# Patient Record
Sex: Female | Born: 1975 | Race: White | Hispanic: No | Smoking: Never smoker
Health system: Southern US, Community
[De-identification: ages and names within clinical notes are randomized; demographics above are authoritative.]

## PROBLEM LIST (undated history)

## (undated) HISTORY — PX: TUBAL LIGATION: SHX77

## (undated) HISTORY — PX: CHOLECYSTECTOMY: SHX55

---

## 2010-06-12 ENCOUNTER — Emergency Department (HOSPITAL_BASED_OUTPATIENT_CLINIC_OR_DEPARTMENT_OTHER)
Admission: EM | Admit: 2010-06-12 | Discharge: 2010-06-12 | Disposition: A | Discharge status: Home / Self Care | Payer: Self-pay | Attending: Emergency Medicine | Admitting: Emergency Medicine

## 2010-06-12 DIAGNOSIS — N390 Urinary tract infection, site not specified: Secondary | ICD-10-CM | POA: Insufficient documentation

## 2010-06-12 DIAGNOSIS — R300 Dysuria: Secondary | ICD-10-CM | POA: Insufficient documentation

## 2010-06-12 LAB — URINALYSIS, ROUTINE W REFLEX MICROSCOPIC
Glucose, UA: NEGATIVE mg/dL
Hgb urine dipstick: NEGATIVE
Ketones, ur: NEGATIVE mg/dL
pH: 6.5 (ref 5.0–8.0)

## 2010-06-12 LAB — URINE MICROSCOPIC-ADD ON

## 2010-07-24 ENCOUNTER — Emergency Department (HOSPITAL_BASED_OUTPATIENT_CLINIC_OR_DEPARTMENT_OTHER)
Admission: EM | Admit: 2010-07-24 | Discharge: 2010-07-24 | Disposition: A | Discharge status: Home / Self Care | Payer: Managed Care, Other (non HMO) | Attending: Emergency Medicine | Admitting: Emergency Medicine

## 2010-07-24 ENCOUNTER — Emergency Department (INDEPENDENT_AMBULATORY_CARE_PROVIDER_SITE_OTHER): Payer: Managed Care, Other (non HMO)

## 2010-07-24 DIAGNOSIS — R112 Nausea with vomiting, unspecified: Secondary | ICD-10-CM

## 2010-07-24 DIAGNOSIS — R51 Headache: Secondary | ICD-10-CM | POA: Insufficient documentation

## 2010-07-24 LAB — URINALYSIS, ROUTINE W REFLEX MICROSCOPIC
Ketones, ur: NEGATIVE mg/dL
Nitrite: NEGATIVE
Protein, ur: NEGATIVE mg/dL

## 2010-07-24 LAB — DIFFERENTIAL
Basophils Absolute: 0 10*3/uL (ref 0.0–0.1)
Basophils Relative: 0 % (ref 0–1)
Eosinophils Absolute: 0.2 10*3/uL (ref 0.0–0.7)
Eosinophils Relative: 2 % (ref 0–5)
Monocytes Absolute: 0.8 10*3/uL (ref 0.1–1.0)

## 2010-07-24 LAB — CBC
HCT: 37.7 % (ref 36.0–46.0)
MCHC: 32.6 g/dL (ref 30.0–36.0)
RDW: 13.8 % (ref 11.5–15.5)

## 2010-07-24 LAB — BASIC METABOLIC PANEL
Calcium: 9.5 mg/dL (ref 8.4–10.5)
GFR calc Af Amer: >60 mL/min (ref >60)
GFR calc non Af Amer: >60 mL/min (ref >60)
Glucose, Bld: 88 mg/dL (ref 70–99)
Sodium: 137 mEq/L (ref 135–145)

## 2010-12-14 ENCOUNTER — Emergency Department (HOSPITAL_BASED_OUTPATIENT_CLINIC_OR_DEPARTMENT_OTHER)
Admission: EM | Admit: 2010-12-14 | Discharge: 2010-12-14 | Disposition: A | Discharge status: Home / Self Care | Payer: Managed Care, Other (non HMO) | Attending: Emergency Medicine | Admitting: Emergency Medicine

## 2010-12-14 ENCOUNTER — Emergency Department (INDEPENDENT_AMBULATORY_CARE_PROVIDER_SITE_OTHER): Payer: Managed Care, Other (non HMO)

## 2010-12-14 ENCOUNTER — Encounter: Payer: Self-pay | Admitting: *Deleted

## 2010-12-14 DIAGNOSIS — R10813 Right lower quadrant abdominal tenderness: Secondary | ICD-10-CM

## 2010-12-14 DIAGNOSIS — R1110 Vomiting, unspecified: Secondary | ICD-10-CM

## 2010-12-14 DIAGNOSIS — R112 Nausea with vomiting, unspecified: Secondary | ICD-10-CM

## 2010-12-14 DIAGNOSIS — R109 Unspecified abdominal pain: Secondary | ICD-10-CM | POA: Insufficient documentation

## 2010-12-14 LAB — WET PREP, GENITAL
Clue Cells Wet Prep HPF POC: NONE SEEN
Yeast Wet Prep HPF POC: NONE SEEN

## 2010-12-14 LAB — COMPREHENSIVE METABOLIC PANEL
ALT: 21 U/L (ref 0–35)
BUN: 9 mg/dL (ref 6–23)
Calcium: 9.3 mg/dL (ref 8.4–10.5)
GFR calc Af Amer: >90 mL/min (ref >90)
Glucose, Bld: 95 mg/dL (ref 70–99)
Sodium: 140 mEq/L (ref 135–145)
Total Protein: 7.4 g/dL (ref 6.0–8.3)

## 2010-12-14 LAB — DIFFERENTIAL
Lymphocytes Relative: 25 % (ref 12–46)
Lymphs Abs: 2.4 10*3/uL (ref 0.7–4.0)
Monocytes Relative: 8 % (ref 3–12)
Neutro Abs: 6 10*3/uL (ref 1.7–7.7)
Neutrophils Relative %: 63 % (ref 43–77)

## 2010-12-14 LAB — CBC
Hemoglobin: 11.7 g/dL — ABNORMAL LOW (ref 12.0–15.0)
RBC: 4.05 MIL/uL (ref 3.87–5.11)
WBC: 9.5 10*3/uL (ref 4.0–10.5)

## 2010-12-14 LAB — URINALYSIS, ROUTINE W REFLEX MICROSCOPIC
Bilirubin Urine: NEGATIVE
Nitrite: NEGATIVE
Specific Gravity, Urine: 1.02 (ref 1.005–1.030)
Urobilinogen, UA: 1 mg/dL (ref 0.0–1.0)

## 2010-12-14 LAB — LIPASE, BLOOD: Lipase: 27 U/L (ref 11–59)

## 2010-12-14 MED ORDER — ONDANSETRON 4 MG PO TBDP: 4.0000 mg | ORAL_TABLET | Freq: Three times a day (TID) | ORAL | Status: AC | PRN

## 2010-12-14 MED ORDER — METOCLOPRAMIDE HCL 5 MG/ML IJ SOLN
10.0000 mg | Freq: Once | INTRAMUSCULAR | Status: AC
  Administered 2010-12-14: 10 mg via INTRAVENOUS
  Filled 2010-12-14: qty 2

## 2010-12-14 MED ORDER — ONDANSETRON HCL 4 MG/2ML IJ SOLN
4.0000 mg | Freq: Once | INTRAMUSCULAR | Status: AC
  Administered 2010-12-14: 4 mg via INTRAVENOUS
  Filled 2010-12-14: qty 2

## 2010-12-14 MED ORDER — SODIUM CHLORIDE 0.9 % IV BOLUS (SEPSIS)
1000.0000 mL | Freq: Once | INTRAVENOUS | Status: AC
  Administered 2010-12-14: 1000 mL via INTRAVENOUS

## 2010-12-14 MED ORDER — IOHEXOL 300 MG/ML  SOLN
100.0000 mL | Freq: Once | INTRAMUSCULAR | Status: AC | PRN
  Administered 2010-12-14: 100 mL via INTRAVENOUS

## 2010-12-14 NOTE — ED Notes (Signed)
CT notified pt has finished first cup oral contrast

## 2010-12-14 NOTE — ED Notes (Signed)
Pt states she has been vomiting for 3 weeks. Pain around umbilicus. No diarrhea.

## 2010-12-14 NOTE — ED Notes (Signed)
MD at bedside. EDNP Pickering at bedside to discuss results with pt

## 2010-12-14 NOTE — ED Provider Notes (Signed)
Medical screening examination/treatment/procedure(s) were performed by non-physician practitioner and as supervising physician I was immediately available for consultation/collaboration.  Celene Kras, MD 12/14/10 [phone]

## 2010-12-14 NOTE — ED Provider Notes (Signed)
History     CSN: [id number] Arrival date & time: 12/14/2010  8:50 PM  Chief Complaint  Patient presents with  . Emesis    (Consider location/radiation/quality/duration/timing/severity/associated sxs/prior treatment) HPI Comments: Pt states that she was seen by her pcp 4 days ago and had and ultrasound that was negative:pt states that today the vomiting seemed worse and she felt like she was having abdominal pain a little lower then she had been  Patient is a 35 y.o. female presenting with vomiting. The history is provided by the patient. No language interpreter was used.  Emesis  This is a new problem. The current episode started more than 1 week ago. The problem occurs 2 to 4 times per day. The problem has been gradually worsening. The emesis has an appearance of stomach contents. There has been no fever. Associated symptoms include abdominal pain. Pertinent negatives include no cough, no diarrhea, no fever and no myalgias.    History reviewed. No pertinent past medical history.  Past Surgical History  Procedure Date  . Tubal ligation   . Cesarean section     History reviewed. No pertinent family history.  History  Substance Use Topics  . Smoking status: Never Smoker   . Smokeless tobacco: Not on file  . Alcohol Use: No    OB History    Grav Para Term Preterm Abortions TAB SAB Ect Mult Living                  Review of Systems  Constitutional: Negative for fever.  Respiratory: Negative for cough.   Gastrointestinal: Positive for vomiting and abdominal pain. Negative for diarrhea.  Musculoskeletal: Negative for myalgias.  All other systems reviewed and are negative.    Allergies  Aspirin; Codeine; and Penicillins  Home Medications   Current Outpatient Rx  Name Route Sig Dispense Refill  . LEVONORGEST-ETH ESTRAD 91-DAY 0.15-0.03 MG PO TABS Oral Take 1 tablet by mouth daily.        BP 123/81  Pulse 101  Temp(Src) 99.1 F (37.3 C) (Oral)  Resp 20  Ht 5'  2" (1.575 m)  Wt 170 lb (77.111 kg)  BMI 31.09 kg/m2  SpO2 97%  Physical Exam  Nursing note and vitals reviewed. Constitutional: She is oriented to person, place, and time. She appears well-developed and well-nourished.  HENT:  Head: Normocephalic and atraumatic.  Eyes: Pupils are equal, round, and reactive to light.  Neck: Normal range of motion. Neck supple.  Cardiovascular: Normal rate and regular rhythm.   Pulmonary/Chest: Effort normal and breath sounds normal.  Abdominal: Soft. There is tenderness in the right lower quadrant.  Genitourinary: Cervix exhibits no motion tenderness. No vaginal discharge found.  Musculoskeletal: Normal range of motion.  Neurological: She is alert and oriented to person, place, and time.  Skin: Skin is warm.  Psychiatric: She has a normal mood and affect.    ED Course  Procedures (including critical care time)  Labs Reviewed  CBC - Abnormal; Notable for the following:    Hemoglobin 11.7 (*)    HCT 35.9 (*)    All other components within normal limits  WET PREP, GENITAL - Abnormal; Notable for the following:    WBC, Wet Prep HPF POC FEW (*) FEW BACTERIA SEEN   All other components within normal limits  URINALYSIS, ROUTINE W REFLEX MICROSCOPIC  PREGNANCY, URINE  DIFFERENTIAL  COMPREHENSIVE METABOLIC PANEL  LIPASE, BLOOD  GC/CHLAMYDIA PROBE AMP, GENITAL   Ct Abdomen Pelvis W Contrast  12/14/2010  *  RADIOLOGY REPORT*  Clinical Data: Right lower quadrant tenderness, nausea and vomiting for 3 weeks.  CT ABDOMEN AND PELVIS WITH CONTRAST  Technique:  Multidetector CT imaging of the abdomen and pelvis was performed following the standard protocol during bolus administration of intravenous contrast.  Contrast: OMNIPAQUE IOHEXOL 300 MG/ML IV SOLN  Comparison: None.  Findings: Visualized lung bases are clear.  The liver, spleen, gallbladder, bile ducts, pancreas, adrenal glands, kidneys, stomach, small bowel, abdominal aorta, and retroperitoneal  lymph nodes are unremarkable.  No free air or free fluid in the abdomen.  Pelvis:  The bladder wall is not thickened.  No free or loculated pelvic fluid collections.  Uterus and adnexal structures are not enlarged.  The appendix is normal.  No inflammatory changes demonstrated in the sigmoid colon.  The colon is generally decompressed.  Normal alignment of the lumbar spine.  IMPRESSION: No acute process demonstrated in the abdomen or pelvis.  Original Report Authenticated By: Marlon Pel, M.D.     No diagnosis found.    MDM  Pt tolerating some fluid here:pt is to follow up with pcp and will treat symptomatically        Teressa Lower, NP 12/14/10 2256

## 2010-12-14 NOTE — ED Notes (Signed)
Returned from ct 

## 2010-12-14 NOTE — ED Notes (Signed)
D/c home with family- rx x 1 for zofran given

## 2010-12-15 LAB — GC/CHLAMYDIA PROBE AMP, GENITAL: Chlamydia, DNA Probe: NEGATIVE

## 2011-05-23 ENCOUNTER — Emergency Department (HOSPITAL_BASED_OUTPATIENT_CLINIC_OR_DEPARTMENT_OTHER)
Admission: EM | Admit: 2011-05-23 | Discharge: 2011-05-23 | Disposition: A | Discharge status: Home / Self Care | Payer: Managed Care, Other (non HMO) | Attending: Emergency Medicine | Admitting: Emergency Medicine

## 2011-05-23 ENCOUNTER — Encounter (HOSPITAL_BASED_OUTPATIENT_CLINIC_OR_DEPARTMENT_OTHER): Payer: Self-pay | Admitting: *Deleted

## 2011-05-23 DIAGNOSIS — R42 Dizziness and giddiness: Secondary | ICD-10-CM | POA: Insufficient documentation

## 2011-05-23 DIAGNOSIS — K117 Disturbances of salivary secretion: Secondary | ICD-10-CM | POA: Insufficient documentation

## 2011-05-23 DIAGNOSIS — R10819 Abdominal tenderness, unspecified site: Secondary | ICD-10-CM | POA: Insufficient documentation

## 2011-05-23 DIAGNOSIS — R000 Tachycardia, unspecified: Secondary | ICD-10-CM | POA: Insufficient documentation

## 2011-05-23 DIAGNOSIS — K529 Noninfective gastroenteritis and colitis, unspecified: Secondary | ICD-10-CM

## 2011-05-23 DIAGNOSIS — R197 Diarrhea, unspecified: Secondary | ICD-10-CM | POA: Insufficient documentation

## 2011-05-23 DIAGNOSIS — K5289 Other specified noninfective gastroenteritis and colitis: Secondary | ICD-10-CM | POA: Insufficient documentation

## 2011-05-23 DIAGNOSIS — R112 Nausea with vomiting, unspecified: Secondary | ICD-10-CM | POA: Insufficient documentation

## 2011-05-23 DIAGNOSIS — R109 Unspecified abdominal pain: Secondary | ICD-10-CM | POA: Insufficient documentation

## 2011-05-23 DIAGNOSIS — R6883 Chills (without fever): Secondary | ICD-10-CM | POA: Insufficient documentation

## 2011-05-23 LAB — URINALYSIS, ROUTINE W REFLEX MICROSCOPIC
Bilirubin Urine: NEGATIVE
Hgb urine dipstick: NEGATIVE
Ketones, ur: NEGATIVE mg/dL
Nitrite: NEGATIVE
Urobilinogen, UA: 0.2 mg/dL (ref 0.0–1.0)

## 2011-05-23 LAB — COMPREHENSIVE METABOLIC PANEL
AST: 17 U/L (ref 0–37)
Albumin: 4.2 g/dL (ref 3.5–5.2)
Alkaline Phosphatase: 65 U/L (ref 39–117)
BUN: 13 mg/dL (ref 6–23)
Potassium: 3.8 mEq/L (ref 3.5–5.1)
Total Protein: 7.7 g/dL (ref 6.0–8.3)

## 2011-05-23 LAB — DIFFERENTIAL
Basophils Absolute: 0 10*3/uL (ref 0.0–0.1)
Basophils Relative: 0 % (ref 0–1)
Eosinophils Absolute: 0 10*3/uL (ref 0.0–0.7)
Monocytes Relative: 5 % (ref 3–12)
Neutro Abs: 7.9 10*3/uL — ABNORMAL HIGH (ref 1.7–7.7)
Neutrophils Relative %: 88 % — ABNORMAL HIGH (ref 43–77)

## 2011-05-23 LAB — CBC
HCT: 38.8 % (ref 36.0–46.0)
Hemoglobin: 12.8 g/dL (ref 12.0–15.0)
MCH: 29.9 pg (ref 26.0–34.0)
MCHC: 33 g/dL (ref 30.0–36.0)
MCV: 90.7 fL (ref 78.0–100.0)
Platelets: 233 10*3/uL (ref 150–400)
RBC: 4.28 MIL/uL (ref 3.87–5.11)
RDW: 13.4 % (ref 11.5–15.5)
WBC: 9 10*3/uL (ref 4.0–10.5)

## 2011-05-23 LAB — LIPASE, BLOOD: Lipase: 21 U/L (ref 11–59)

## 2011-05-23 MED ORDER — SODIUM CHLORIDE 0.9 % IV SOLN: Freq: Once | INTRAVENOUS | Status: DC

## 2011-05-23 MED ORDER — LOPERAMIDE HCL 2 MG PO CAPS
2.0000 mg | ORAL_CAPSULE | ORAL | Status: DC | PRN
  Administered 2011-05-23: 2 mg via ORAL
  Filled 2011-05-23: qty 1

## 2011-05-23 MED ORDER — ONDANSETRON HCL 4 MG PO TABS: 4.0000 mg | ORAL_TABLET | Freq: Four times a day (QID) | ORAL | Status: AC

## 2011-05-23 MED ORDER — ONDANSETRON HCL 4 MG/2ML IJ SOLN
4.0000 mg | Freq: Once | INTRAMUSCULAR | Status: AC
  Administered 2011-05-23: 4 mg via INTRAVENOUS
  Filled 2011-05-23: qty 2

## 2011-05-23 MED ORDER — SODIUM CHLORIDE 0.9 % IV BOLUS (SEPSIS)
1000.0000 mL | Freq: Once | INTRAVENOUS | Status: AC
  Administered 2011-05-23: 1000 mL via INTRAVENOUS

## 2011-05-23 MED ORDER — DIPHENOXYLATE-ATROPINE 2.5-0.025 MG PO TABS: 1.0000 | ORAL_TABLET | Freq: Four times a day (QID) | ORAL | Status: AC | PRN

## 2011-05-23 NOTE — ED Notes (Signed)
Abd pain, N/V/D since this a.m. Also c/o dizziness

## 2011-05-23 NOTE — ED Provider Notes (Signed)
History  This chart was scribed for Toy Baker, MD by Bennett Scrape. This patient was seen in room MH09/MH09 and the patient's care was started at 3:40PM.  CSN: [id number]  Arrival date & time 05/23/11  1413   First MD Initiated Contact with Patient 05/23/11 1535      Chief Complaint  Patient presents with  . Abdominal Pain    The history is provided by the patient. No language interpreter was used.    Julie Ingram is a 36 y.o. female who presents to the Emergency Department complaining of 12 hours of sudden onset, gradually worsening, constant abdominal pain with associated nausea, emesis and diarrhea. She describes the abdominal pain as being a diffuse cramping feeling. Pt also c/o chills and mild dizziness with standing but states that these symptoms have resolved upon arrival to the ED. She denies any modifying factors. She denies taking any medications PTA to improve symptoms.  Pt works as a Emergency planning/management officer and has been exposed to the general public where she could have come in contact with people with similar symptoms. She denies cough, SOB, sore throat and ear pain as associated symptoms. She reports that she has a h/o chronic emesis but states that she has no current diagnosis for the symptoms. She denies smoking and alcohol use.  History reviewed. No pertinent past medical history.  Past Surgical History  Procedure Date  . Tubal ligation   . Cesarean section     History reviewed. No pertinent family history.  History  Substance Use Topics  . Smoking status: Never Smoker   . Smokeless tobacco: Not on file  . Alcohol Use: No     Review of Systems  Constitutional: Positive for chills. Negative for fever.  HENT: Negative for ear pain, congestion and sore throat.   Eyes: Negative for pain.  Respiratory: Negative for cough and shortness of breath.   Cardiovascular: Negative for chest pain.  Gastrointestinal: Positive for nausea, vomiting, abdominal pain and  diarrhea.  Genitourinary: Negative for dysuria, urgency and hematuria.  Musculoskeletal: Negative for back pain.  Skin: Negative for rash.  Neurological: Positive for dizziness.  Psychiatric/Behavioral: Negative for confusion.    Allergies  Aspirin; Codeine; and Penicillins  Home Medications   Current Outpatient Rx  Name Route Sig Dispense Refill  . LEVONORGEST-ETH ESTRAD 91-DAY 0.15-0.03 MG PO TABS Oral Take 1 tablet by mouth daily.        Triage Vitals: BP 117/75  Pulse 125  Temp(Src) 98.8 F (37.1 C) (Oral)  Resp 20  Ht 5\' 2"  (1.575 m)  Wt 167 lb (75.751 kg)  BMI 30.54 kg/m2  SpO2 98%  LMP 04/25/2011  Physical Exam  Nursing note and vitals reviewed. Constitutional: She is oriented to person, place, and time. She appears well-developed and well-nourished.  HENT:  Head: Normocephalic and atraumatic.  Mouth/Throat: Oropharynx is clear and moist.       Mildly dry mucous membranes  Eyes: Conjunctivae and EOM are normal.  Neck: Normal range of motion. Neck supple.  Cardiovascular: Regular rhythm and normal heart sounds.  Tachycardia present.   Pulmonary/Chest: Effort normal and breath sounds normal.  Abdominal: Soft. She exhibits no distension. There is tenderness (Mild LUQ and RUQ tenderness). There is no rebound and no guarding.  Musculoskeletal: Normal range of motion. She exhibits no edema.  Neurological: She is alert and oriented to person, place, and time. No cranial nerve deficit.  Skin: Skin is warm and dry.  Psychiatric: She has a normal mood  and affect. Her behavior is normal.    ED Course  Procedures (including critical care time)  DIAGNOSTIC STUDIES: Oxygen Saturation is 98% on room air , normal by my interpretation.    COORDINATION OF CARE: 3:46PM-Discussed treatment plan with pt and pt agreed to plan.   Labs Reviewed  URINALYSIS, ROUTINE W REFLEX MICROSCOPIC - Abnormal; Notable for the following:    APPearance CLOUDY (*)    All other components  within normal limits  DIFFERENTIAL - Abnormal; Notable for the following:    Neutrophils Relative 88 (*)    Neutro Abs 7.9 (*)    Lymphocytes Relative 7 (*)    Lymphs Abs 0.6 (*)    All other components within normal limits  PREGNANCY, URINE  CBC  COMPREHENSIVE METABOLIC PANEL  LIPASE, BLOOD   No results found.   No diagnosis found.    MDM  Patient given IV fluids and medications. She feels that this time. Repeat exam is negative. Patient likely has gastroenteritis no sign for appendicitis. Patient will be discharged      I personally performed the services described in this documentation, which was scribed in my presence. The recorded information has been reviewed and considered.     Toy Baker, MD 05/23/11 1726

## 2011-05-23 NOTE — Discharge Instructions (Signed)
Viral Gastroenteritis Viral gastroenteritis is also known as stomach flu. This condition affects the stomach and intestinal tract. It can cause sudden diarrhea and vomiting. The illness typically lasts 3 to 8 days. Most people develop an immune response that eventually gets rid of the virus. While this natural response develops, the virus can make you quite ill. CAUSES  Many different viruses can cause gastroenteritis, such as rotavirus or noroviruses. You can catch one of these viruses by consuming contaminated food or water. You may also catch a virus by sharing utensils or other personal items with an infected person or by touching a contaminated surface. SYMPTOMS  The most common symptoms are diarrhea and vomiting. These problems can cause a severe loss of body fluids (dehydration) and a body salt (electrolyte) imbalance. Other symptoms may include:  Fever.   Headache.   Fatigue.   Abdominal pain.  DIAGNOSIS  Your caregiver can usually diagnose viral gastroenteritis based on your symptoms and a physical exam. A stool sample may also be taken to test for the presence of viruses or other infections. TREATMENT  This illness typically goes away on its own. Treatments are aimed at rehydration. The most serious cases of viral gastroenteritis involve vomiting so severely that you are not able to keep fluids down. In these cases, fluids must be given through an intravenous line (IV). HOME CARE INSTRUCTIONS   Drink enough fluids to keep your urine clear or pale yellow. Drink small amounts of fluids frequently and increase the amounts as tolerated.   Ask your caregiver for specific rehydration instructions.   Avoid:   Foods high in sugar.   Alcohol.   Carbonated drinks.   Tobacco.   Juice.   Caffeine drinks.   Extremely hot or cold fluids.   Fatty, greasy foods.   Too much intake of anything at one time.   Dairy products until 24 to 48 hours after diarrhea stops.   You may  consume probiotics. Probiotics are active cultures of beneficial bacteria. They may lessen the amount and number of diarrheal stools in adults. Probiotics can be found in yogurt with active cultures and in supplements.   Wash your hands well to avoid spreading the virus.   Only take over-the-counter or prescription medicines for pain, discomfort, or fever as directed by your caregiver. Do not give aspirin to children. Antidiarrheal medicines are not recommended.   Ask your caregiver if you should continue to take your regular prescribed and over-the-counter medicines.   Keep all follow-up appointments as directed by your caregiver.  SEEK IMMEDIATE MEDICAL CARE IF:   You are unable to keep fluids down.   You do not urinate at least once every 6 to 8 hours.   You develop shortness of breath.   You notice blood in your stool or vomit. This may look like coffee grounds.   You have abdominal pain that increases or is concentrated in one small area (localized).   You have persistent vomiting or diarrhea.   You have a fever.   The patient is a child younger than 3 months, and he or she has a fever.   The patient is a child older than 3 months, and he or she has a fever and persistent symptoms.   The patient is a child older than 3 months, and he or she has a fever and symptoms suddenly get worse.   The patient is a baby, and he or she has no tears when crying.  MAKE SURE YOU:     Understand these instructions.   Will watch your condition.   Will get help right away if you are not doing well or get worse.  Document Released: 02/16/2005 Document Revised: 02/05/2011 Document Reviewed: 12/03/2010 [employer] Patient Information 2012 [employer]. 

## 2011-05-23 NOTE — ED Notes (Signed)
Rx x 2 given for zofran and lomotil- pt has a ride at bedside

## 2012-10-13 IMAGING — CT CT HEAD W/O CM
1 series · 16 of 30 positions shown, 20 images · non-contrast
Comparison: None.

CLINICAL DATA: Severe headache.  Nausea and vomiting.

CT HEAD WITHOUT CONTRAST
TECHNIQUE: Contiguous axial images were obtained from the base of
the skull through the vertex without contrast.

[Series 2: head 4.8 h37s · axial · 0.45mm/px · z∈[-140,-7]mm · 16 of 32 slices shown, 20 images]
[im 2/32  brain]
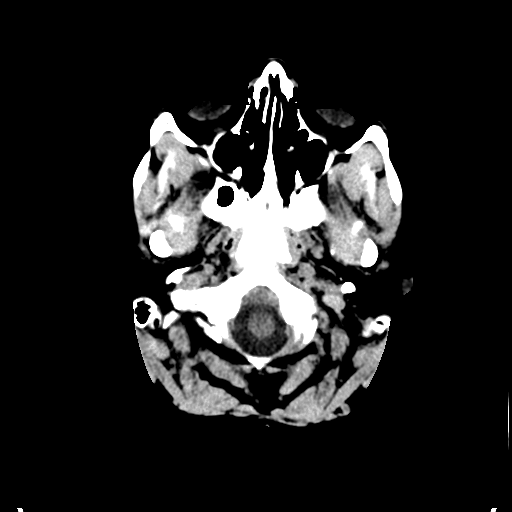
[im 2/32  bone]
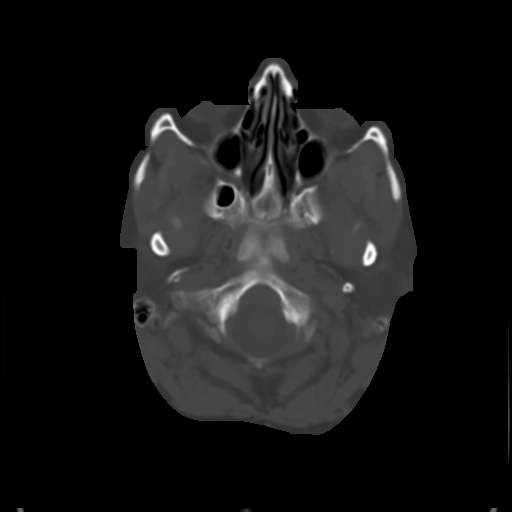
[im 4/32  brain]
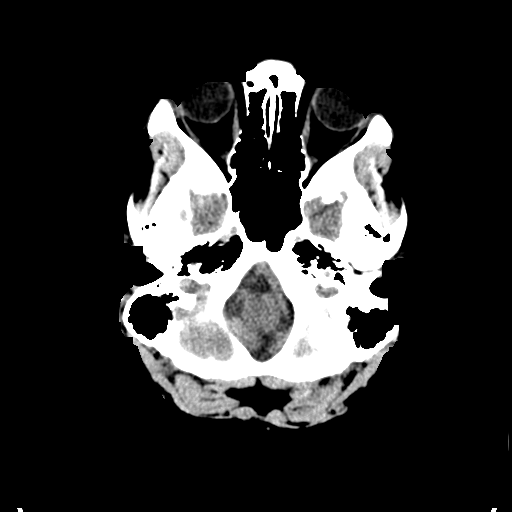
[im 6/32  brain]
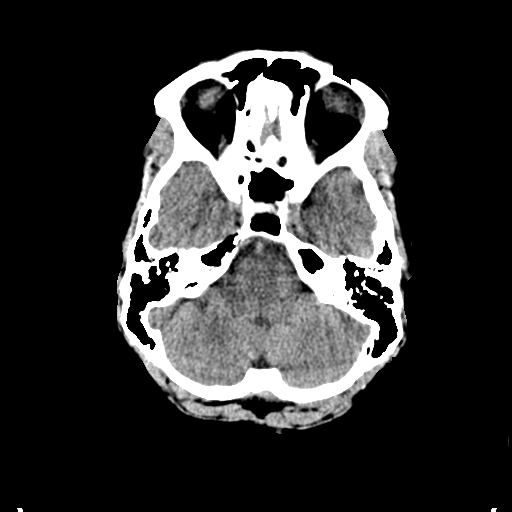
[im 8/32  brain]
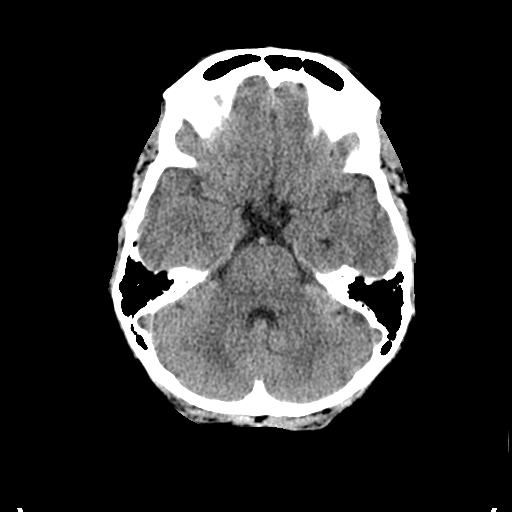
[im 9/32  brain]
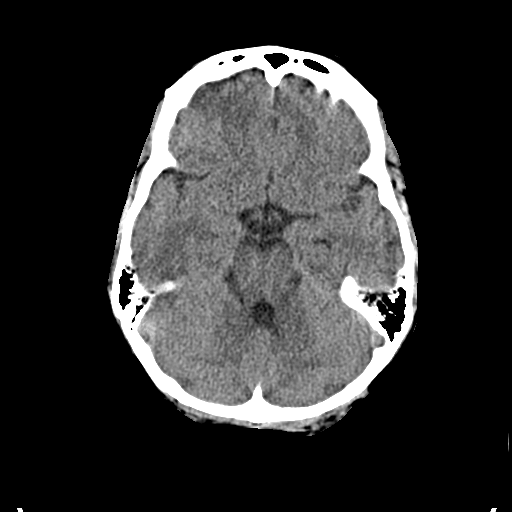
[im 9/32  bone]
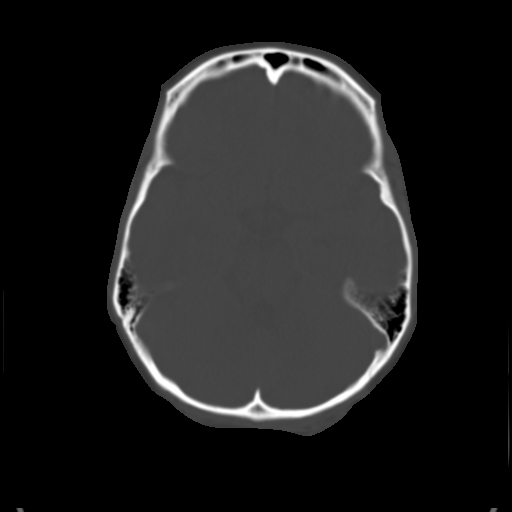
[im 11/32  brain]
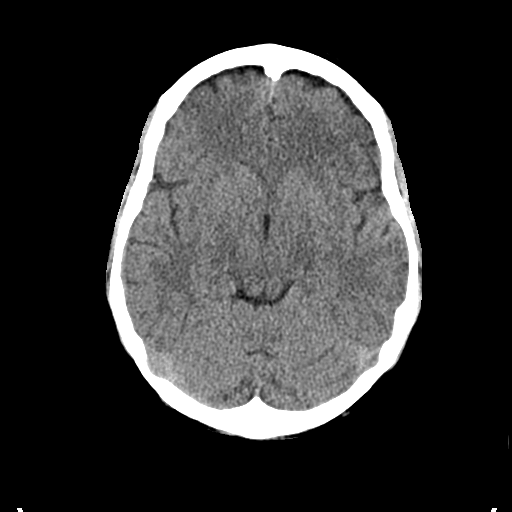
[im 13/32  brain]
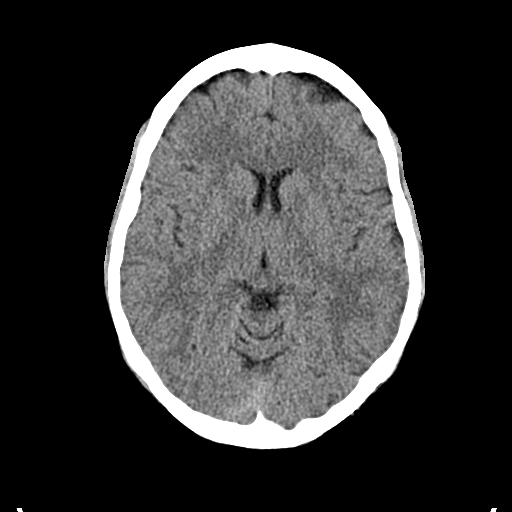
[im 15/32  brain]
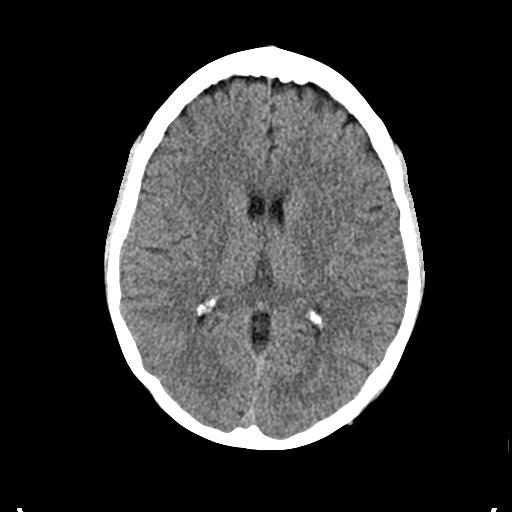
[im 17/32  brain]
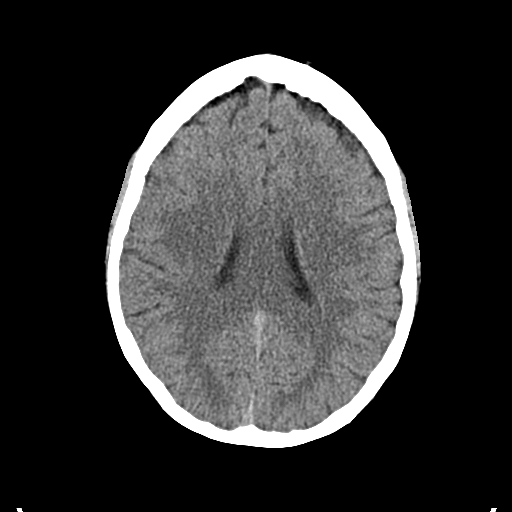
[im 17/32  bone]
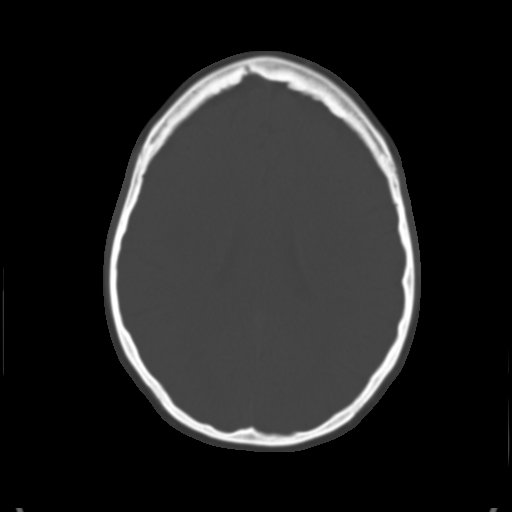
[im 19/32  brain]
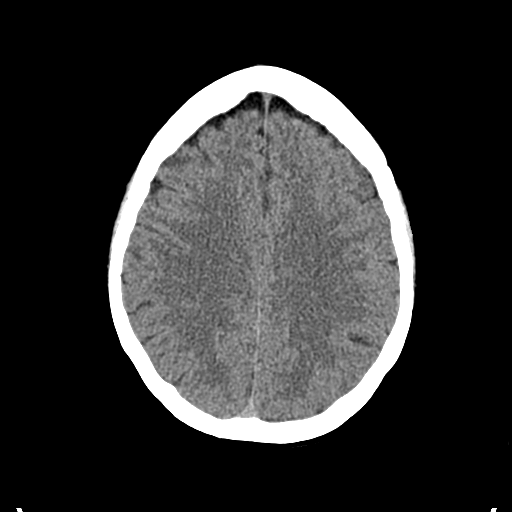
[im 21/32  brain]
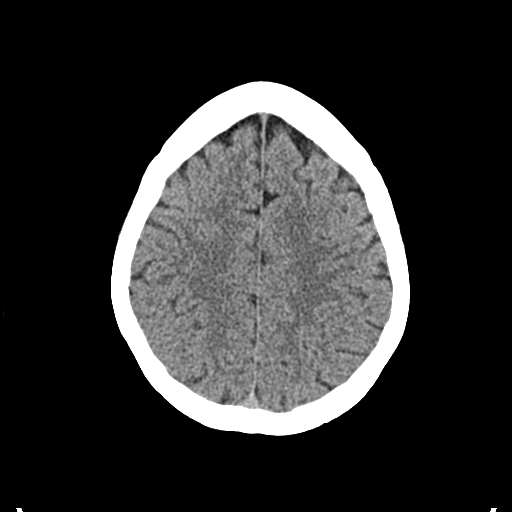
[im 23/32  brain]
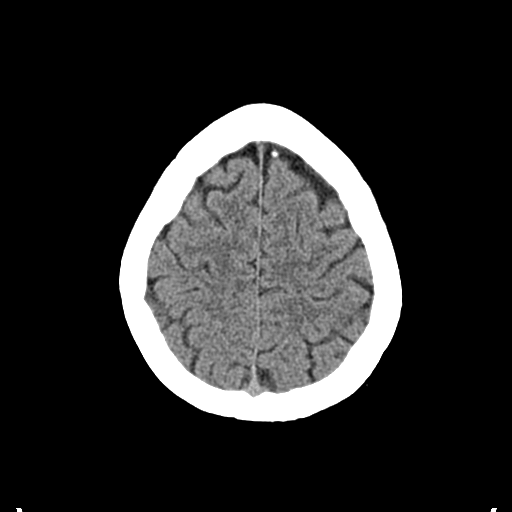
[im 24/32  brain]
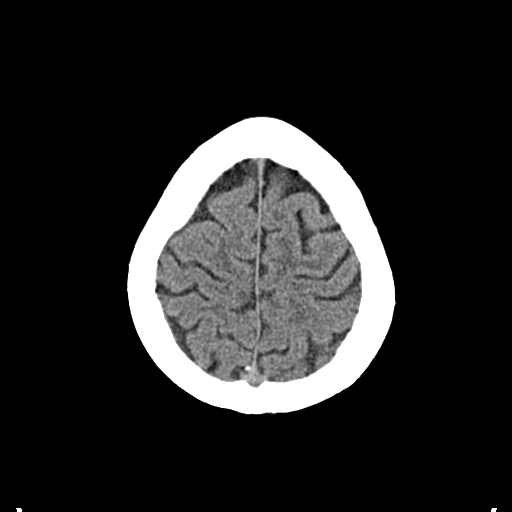
[im 24/32  bone]
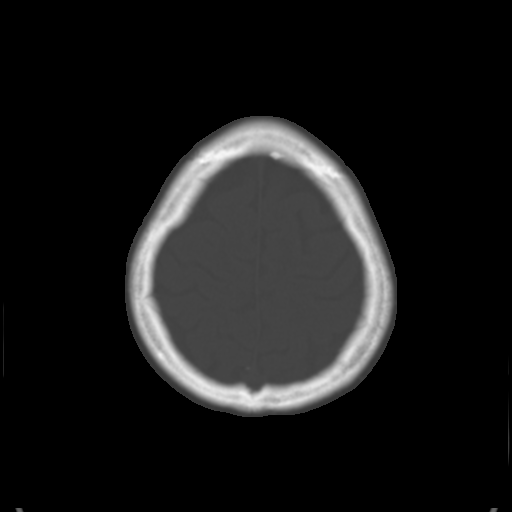
[im 26/32  brain]
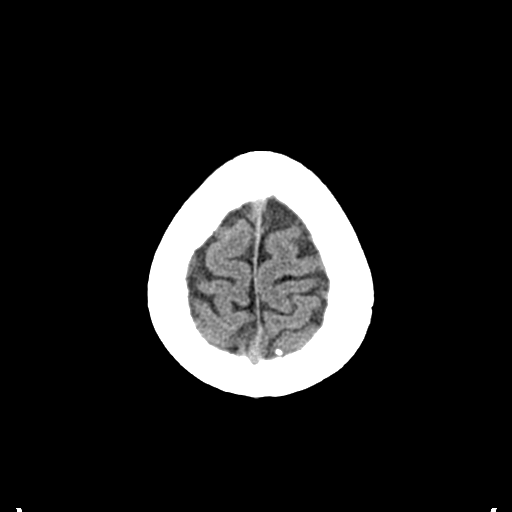
[im 28/32  brain]
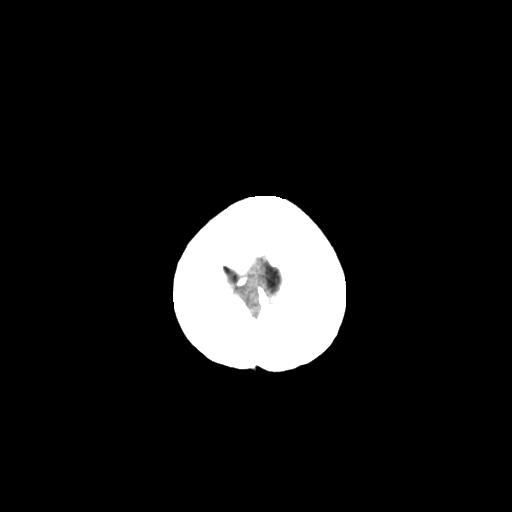
[im 30/32  brain]
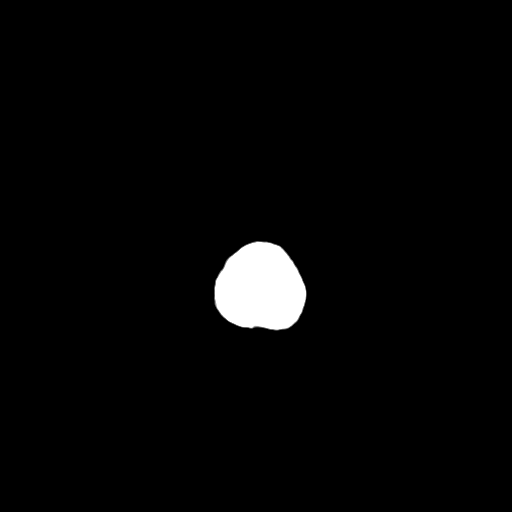

[16 of 30 positions shown; findings below may reference images not displayed]

FINDINGS: No acute intracranial abnormality is present.
Specifically, there is no evidence for acute infarct, hemorrhage,
mass, hydrocephalus, or extra-axial fluid collection.  The
paranasal sinuses and mastoid air cells are clear.  The globes and
orbits are intact.  The osseous skull is intact.
IMPRESSION: Negative CT of the head.

## 2014-07-19 ENCOUNTER — Other Ambulatory Visit: Payer: Self-pay | Admitting: Surgery

## 2014-07-19 DIAGNOSIS — R609 Edema, unspecified: Secondary | ICD-10-CM

## 2016-11-25 ENCOUNTER — Emergency Department (HOSPITAL_BASED_OUTPATIENT_CLINIC_OR_DEPARTMENT_OTHER)
Admission: EM | Admit: 2016-11-25 | Discharge: 2016-11-25 | Disposition: A | Discharge status: Home / Self Care | Payer: Managed Care, Other (non HMO) | Attending: Emergency Medicine | Admitting: Emergency Medicine

## 2016-11-25 ENCOUNTER — Encounter (HOSPITAL_BASED_OUTPATIENT_CLINIC_OR_DEPARTMENT_OTHER): Payer: Self-pay | Admitting: Emergency Medicine

## 2016-11-25 DIAGNOSIS — R1030 Lower abdominal pain, unspecified: Secondary | ICD-10-CM | POA: Diagnosis not present

## 2016-11-25 DIAGNOSIS — R112 Nausea with vomiting, unspecified: Secondary | ICD-10-CM | POA: Diagnosis present

## 2016-11-25 DIAGNOSIS — Z79899 Other long term (current) drug therapy: Secondary | ICD-10-CM | POA: Insufficient documentation

## 2016-11-25 DIAGNOSIS — E860 Dehydration: Secondary | ICD-10-CM | POA: Diagnosis not present

## 2016-11-25 DIAGNOSIS — R1111 Vomiting without nausea: Secondary | ICD-10-CM

## 2016-11-25 LAB — COMPREHENSIVE METABOLIC PANEL
ALBUMIN: 4 g/dL (ref 3.5–5.0)
ALK PHOS: 69 U/L (ref 38–126)
ALT: 52 U/L (ref 14–54)
AST: 29 U/L (ref 15–41)
Anion gap: 10 (ref 5–15)
BUN: 13 mg/dL (ref 6–20)
CALCIUM: 9.1 mg/dL (ref 8.9–10.3)
CO2: 25 mmol/L (ref 22–32)
Chloride: 100 mmol/L — ABNORMAL LOW (ref 101–111)
Creatinine, Ser: 0.74 mg/dL (ref 0.44–1.00)
GFR calc Af Amer: >60 mL/min (ref >60)
GFR calc non Af Amer: >60 mL/min (ref >60)
GLUCOSE: 112 mg/dL — AB (ref 65–99)
Potassium: 3.1 mmol/L — ABNORMAL LOW (ref 3.5–5.1)
Sodium: 135 mmol/L (ref 135–145)
TOTAL PROTEIN: 8.5 g/dL — AB (ref 6.5–8.1)
Total Bilirubin: 0.5 mg/dL (ref 0.3–1.2)

## 2016-11-25 LAB — URINALYSIS, MICROSCOPIC (REFLEX)

## 2016-11-25 LAB — CBC
HEMATOCRIT: 39.5 % (ref 36.0–46.0)
Hemoglobin: 13 g/dL (ref 12.0–15.0)
MCH: 29.3 pg (ref 26.0–34.0)
MCHC: 32.9 g/dL (ref 30.0–36.0)
MCV: 89.2 fL (ref 78.0–100.0)
Platelets: 259 10*3/uL (ref 150–400)
RBC: 4.43 MIL/uL (ref 3.87–5.11)
RDW: 14 % (ref 11.5–15.5)
WBC: 9.4 10*3/uL (ref 4.0–10.5)

## 2016-11-25 LAB — URINALYSIS, ROUTINE W REFLEX MICROSCOPIC
Glucose, UA: NEGATIVE mg/dL
Ketones, ur: NEGATIVE mg/dL
Leukocytes, UA: NEGATIVE
NITRITE: NEGATIVE
PH: 6 (ref 5.0–8.0)
PROTEIN: 100 mg/dL — AB
Specific Gravity, Urine: 1.025 (ref 1.005–1.030)

## 2016-11-25 LAB — PREGNANCY, URINE: PREG TEST UR: NEGATIVE

## 2016-11-25 MED ORDER — ONDANSETRON 4 MG PO TBDP: 4.0000 mg | ORAL_TABLET | Freq: Three times a day (TID) | ORAL | 0 refills | Status: DC | PRN

## 2016-11-25 MED ORDER — CEPHALEXIN 500 MG PO CAPS: 500.0000 mg | ORAL_CAPSULE | Freq: Two times a day (BID) | ORAL | 0 refills | Status: DC

## 2016-11-25 MED ORDER — SODIUM CHLORIDE 0.9 % IV BOLUS (SEPSIS)
1000.0000 mL | Freq: Once | INTRAVENOUS | Status: AC
  Administered 2016-11-25: 1000 mL via INTRAVENOUS

## 2016-11-25 MED ORDER — ONDANSETRON HCL 4 MG/2ML IJ SOLN
4.0000 mg | Freq: Once | INTRAMUSCULAR | Status: AC
  Administered 2016-11-25: 4 mg via INTRAVENOUS
  Filled 2016-11-25: qty 2

## 2016-11-25 NOTE — ED Notes (Signed)
Pt tolerated po fluids  

## 2016-11-25 NOTE — Discharge Instructions (Signed)
Stop Z-pack Start Keflex Rest and drink plenty of fluids Take Zofran for nausea as needed

## 2016-11-25 NOTE — ED Notes (Signed)
Pt will notify staff when she feels less nauseous and able to eat/drink.

## 2016-11-25 NOTE — ED Provider Notes (Signed)
[facility]   CSN: [id number] Arrival date & time: 11/25/16  1701   History   Chief Complaint Chief Complaint  Patient presents with  . Emesis    HPI Julie Ingram is a 41 y.o. female who presents with nausea and vomiting. No significant PMH. She states that she started to feel bad about four days ago. She had lower abdominal pain which has resolved but Sunday started to have a fever and sore throat. She went to UC and had a positive rapid strep test. She was given a Z-pack which she has been taking as prescribed. She reports ongoing fever, feeling unwell, and nausea and vomiting. She has not been able to eat or drink normally for three days. She reports her sore throat is improving. She denies chest pain, SOB, cough, abdominal pain, diarrhea, dysuria.   HPI  History reviewed. No pertinent past medical history.  There are no active problems to display for this patient.   Past Surgical History:  Procedure Laterality Date  . CESAREAN SECTION    . CHOLECYSTECTOMY    . TUBAL LIGATION      OB History    No data available       Home Medications    Prior to Admission medications   Medication Sig Start Date End Date Taking? Authorizing Provider  levonorgestrel-ethinyl estradiol (SEASONALE,INTROVALE,JOLESSA) 0.15-0.03 MG tablet Take 1 tablet by mouth daily.      [provider]    Family History No family history on file.  Social History Social History  Substance Use Topics  . Smoking status: Never Smoker  . Smokeless tobacco: Never Used  . Alcohol use No     Allergies   Aspirin; Codeine; and Penicillins   Review of Systems Review of Systems  Constitutional: Positive for appetite change, fatigue and fever.  HENT: Positive for sore throat.   Respiratory: Negative for cough and shortness of breath.   Cardiovascular: Negative for chest pain.  Gastrointestinal: Positive for abdominal pain (resolved), nausea and vomiting.  Negative for diarrhea.  Genitourinary: Negative for dysuria.  All other systems reviewed and are negative.    Physical Exam Updated Vital Signs BP 128/88 (BP Location: Left Arm)   Pulse (!) 113   Temp 99.1 F (37.3 C) (Oral)   Ht  (1.6 m)   Wt 80.7 kg (178 lb)   LMP 11/05/2016   SpO2 99%   BMI 31.53 kg/m   Physical Exam  Constitutional: She is oriented to person, place, and time. She appears well-developed and well-nourished. No distress.  Appears fatigued  HENT:  Head: Normocephalic and atraumatic.  Mouth/Throat: Oropharyngeal exudate, posterior oropharyngeal edema and tonsillar abscesses present.  Eyes: Pupils are equal, round, and reactive to light. Conjunctivae are normal. Right eye exhibits no discharge. Left eye exhibits no discharge. No scleral icterus.  Neck: Normal range of motion.  Cardiovascular: Tachycardia present.  Exam reveals no gallop and no friction rub.   No murmur heard. Pulmonary/Chest: Effort normal and breath sounds normal. No respiratory distress. She has no wheezes. She has no rales. She exhibits no tenderness.  Abdominal: Soft. Bowel sounds are normal. She exhibits no distension and no mass. There is no tenderness. There is no rebound and no guarding. No hernia.  Neurological: She is alert and oriented to person, place, and time.  Skin: Skin is warm and dry.  Psychiatric: She has a normal mood and affect. Her behavior is normal.  Nursing note and vitals reviewed.  ED Treatments / Results  Labs (all labs ordered are listed, but only abnormal results are displayed) Labs Reviewed  COMPREHENSIVE METABOLIC PANEL - Abnormal; Notable for the following:       Result Value   Potassium 3.1 (*)    Chloride 100 (*)    Glucose, Bld 112 (*)    Total Protein 8.5 (*)    All other components within normal limits  URINALYSIS, ROUTINE W REFLEX MICROSCOPIC - Abnormal; Notable for the following:    APPearance HAZY (*)    Hgb urine dipstick LARGE (*)     Bilirubin Urine SMALL (*)    Protein, ur 100 (*)    All other components within normal limits  URINALYSIS, MICROSCOPIC (REFLEX) - Abnormal; Notable for the following:    Bacteria, UA FEW (*)    Squamous Epithelial / LPF 0-5 (*)    All other components within normal limits  CBC  PREGNANCY, URINE    EKG  EKG Interpretation None       Radiology No results found.  Procedures Procedures (including critical care time)  Medications Ordered in ED Medications  ondansetron (ZOFRAN) injection 4 mg (4 mg Intravenous Given 11/25/16 1820)  sodium chloride 0.9 % bolus 1,000 mL (1,000 mLs Intravenous New Bag/Given 11/25/16 1820)     Initial Impression / Assessment and Plan / ED Course  I have reviewed the triage vital signs and the nursing notes.  Pertinent labs & imaging results that were available during my care of the patient were reviewed by me and considered in my medical decision making (see chart for details).  41 year old female presents with generalized malaise, nausea, and vomiting. She is tachycardic but otherwise vitals are normal. Exam is remarkable for swollen tonsils with exudates. Labs are remarkable for hypokalemia of 3.1. Will give IVF, Zofran and PO challenge.  On recheck, she feels better. Will change antibiotic to Keflex and rx give for Zofran. Advised supportive care. Work note given. Return precautions given.  Final Clinical Impressions(s) / ED Diagnoses   Final diagnoses:  Dehydration  Vomiting without nausea, intractability of vomiting not specified, unspecified vomiting type    New Prescriptions New Prescriptions   No medications on file     Beryle Quant 11/25/16 Pershing Proud, MD 11/26/16 [phone]

## 2016-11-25 NOTE — ED Triage Notes (Signed)
Pt dx with strep throat Monday. Given abx at [facility]. Pt reports ongoing fever and vomiting.

## 2017-09-27 ENCOUNTER — Encounter (HOSPITAL_BASED_OUTPATIENT_CLINIC_OR_DEPARTMENT_OTHER): Payer: Self-pay | Admitting: *Deleted

## 2017-09-27 ENCOUNTER — Other Ambulatory Visit: Payer: Self-pay

## 2017-09-27 ENCOUNTER — Emergency Department (HOSPITAL_BASED_OUTPATIENT_CLINIC_OR_DEPARTMENT_OTHER)
Admission: EM | Admit: 2017-09-27 | Discharge: 2017-09-27 | Disposition: A | Discharge status: Home / Self Care | Payer: Managed Care, Other (non HMO) | Attending: Emergency Medicine | Admitting: Emergency Medicine

## 2017-09-27 ENCOUNTER — Emergency Department (HOSPITAL_BASED_OUTPATIENT_CLINIC_OR_DEPARTMENT_OTHER): Payer: Managed Care, Other (non HMO)

## 2017-09-27 DIAGNOSIS — R51 Headache: Secondary | ICD-10-CM | POA: Diagnosis present

## 2017-09-27 DIAGNOSIS — R519 Headache, unspecified: Secondary | ICD-10-CM

## 2017-09-27 MED ORDER — METOCLOPRAMIDE HCL 5 MG/ML IJ SOLN
10.0000 mg | Freq: Once | INTRAMUSCULAR | Status: AC
  Administered 2017-09-27: 10 mg via INTRAVENOUS
  Filled 2017-09-27: qty 2

## 2017-09-27 MED ORDER — DIPHENHYDRAMINE HCL 50 MG/ML IJ SOLN
25.0000 mg | Freq: Once | INTRAMUSCULAR | Status: AC
  Administered 2017-09-27: 25 mg via INTRAVENOUS
  Filled 2017-09-27: qty 1

## 2017-09-27 MED ORDER — FLUTICASONE PROPIONATE 50 MCG/ACT NA SUSP: 1.0000 | Freq: Every day | NASAL | 2 refills | Status: DC

## 2017-09-27 MED ORDER — SODIUM CHLORIDE 0.9 % IV BOLUS
1000.0000 mL | Freq: Once | INTRAVENOUS | Status: AC
  Administered 2017-09-27: 1000 mL via INTRAVENOUS

## 2017-09-27 NOTE — ED Triage Notes (Signed)
Headache since last night but worse today. Hx of headaches.

## 2017-09-27 NOTE — ED Provider Notes (Signed)
[facility] EMERGENCY DEPARTMENT Provider Note   CSN: [id number] Arrival date & time: 09/27/17  1807     History   Chief Complaint Chief Complaint  Patient presents with  . Headache    HPI Julie Ingram is a 42 y.o. female.  She presents with headache and frontal head pressure that is been on and off for months.  She thought it was her sinuses and her doctor prescribed her some prednisone which helped for a couple days but she continues to have the symptoms.  It is worse around her eyes.  It is not associate with any nasal congestion, blurry vision, double vision.  It causes her intermittent headaches and she is had a headache since last evening that has not been responsive to Tylenol.  She is got some light sensitivity.  There is been no fevers or chills.  No trauma.  Among other things she has an allergy to aspirin and also does not take ibuprofen or other NSAIDs.  She does not have a history of migraines but it runs in her family.  It is also associated with feeling lightheaded but no sense of spinning.  The history is provided by the patient.  Headache   This is a recurrent problem. The current episode started yesterday. The problem occurs constantly. The problem has not changed since onset.The headache is associated with bright light. The pain is located in the frontal region. The pain is moderate. The pain does not radiate. Pertinent negatives include no fever, no malaise/fatigue, no chest pressure, no near-syncope, no palpitations, no syncope, no shortness of breath, no nausea and no vomiting. She has tried acetaminophen for the symptoms. The treatment provided no relief.    History reviewed. No pertinent past medical history.  There are no active problems to display for this patient.   Past Surgical History:  Procedure Laterality Date  . CESAREAN SECTION    . CHOLECYSTECTOMY    . TUBAL LIGATION       OB History   None      Home Medications    Prior to  Admission medications   Medication Sig Start Date End Date Taking? Authorizing Provider  cephALEXin (KEFLEX) 500 MG capsule Take 1 capsule (500 mg total) by mouth 2 (two) times daily. 11/25/16   Bethel Born, PA-C  levonorgestrel-ethinyl estradiol (SEASONALE,INTROVALE,JOLESSA) 0.15-0.03 MG tablet Take 1 tablet by mouth daily.      [provider]  ondansetron (ZOFRAN ODT) 4 MG disintegrating tablet Take 1 tablet (4 mg total) by mouth every 8 (eight) hours as needed for nausea or vomiting. 11/25/16   Bethel Born, PA-C    Family History No family history on file.  Social History Social History   Tobacco Use  . Smoking status: Never Smoker  . Smokeless tobacco: Never Used  Substance Use Topics  . Alcohol use: No  . Drug use: No     Allergies   Aspirin; Codeine; and Penicillins   Review of Systems Review of Systems  Constitutional: Negative for fever and malaise/fatigue.  HENT: Positive for sinus pressure and sinus pain. Negative for ear pain, nosebleeds and sore throat.   Eyes: Positive for photophobia. Negative for pain and visual disturbance.  Respiratory: Negative for shortness of breath.   Cardiovascular: Negative for chest pain, palpitations, syncope and near-syncope.  Gastrointestinal: Negative for abdominal pain, nausea and vomiting.  Genitourinary: Negative for dysuria.  Musculoskeletal: Negative for back pain, neck pain and neck stiffness.  Skin: Negative for rash.  Neurological: Positive for headaches.     Physical Exam Updated Vital Signs BP 127/87   Pulse (!) 101   Temp 98.2 F (36.8 C) (Oral)   Resp 20   Ht 5\' 3"  (1.6 m)   Wt 77.1 kg (170 lb)   LMP 09/13/2017   SpO2 98%   BMI 30.11 kg/m   Physical Exam  Constitutional: She is oriented to person, place, and time. She appears well-developed and well-nourished. No distress.  HENT:  Head: Normocephalic and atraumatic.  Mouth/Throat: Oropharynx is clear and moist.  Eyes: Pupils are  equal, round, and reactive to light. Conjunctivae and EOM are normal. Right eye exhibits no nystagmus. Left eye exhibits no nystagmus.  Neck: Normal range of motion. Neck supple. No neck rigidity. No Brudzinski's sign and no Kernig's sign noted.  Cardiovascular: Normal rate, regular rhythm, normal heart sounds and intact distal pulses.  No murmur heard. Pulmonary/Chest: Effort normal and breath sounds normal. No respiratory distress.  Abdominal: Soft. There is no tenderness.  Musculoskeletal: Normal range of motion. She exhibits no edema.  Neurological: She is alert and oriented to person, place, and time. She has normal strength. No cranial nerve deficit or sensory deficit. Gait normal. GCS eye subscore is 4. GCS verbal subscore is 5. GCS motor subscore is 6.  Skin: Skin is warm and dry. Capillary refill takes less than 2 seconds.  Psychiatric: She has a normal mood and affect.  Nursing note and vitals reviewed.    ED Treatments / Results  Labs (all labs ordered are listed, but only abnormal results are displayed) Labs Reviewed - No data to display  EKG None  Radiology Ct Head Wo Contrast  Result Date: 09/27/2017 CLINICAL DATA:  Increased headache for 2 months. EXAM: CT HEAD WITHOUT CONTRAST TECHNIQUE: Contiguous axial images were obtained from the base of the skull through the vertex without intravenous contrast. COMPARISON:  Head CT 07/24/2010 FINDINGS: Brain: There is no mass, hemorrhage or extra-axial collection. The size and configuration of the ventricles and extra-axial CSF spaces are normal. There is no acute or chronic infarction. The brain parenchyma is normal. Vascular: No abnormal hyperdensity of the major intracranial arteries or dural venous sinuses. No intracranial atherosclerosis. Skull: The visualized skull base, calvarium and extracranial soft tissues are normal. Sinuses/Orbits: No fluid levels or advanced mucosal thickening of the visualized paranasal sinuses. No mastoid  or middle ear effusion. The orbits are normal. IMPRESSION: Normal head CT. Electronically Signed   By: Deatra RobinsonKevin  Herman M.D.   On: 09/27/2017 18:59    Procedures Procedures (including critical care time)  Medications Ordered in ED Medications  metoCLOPramide (REGLAN) injection 10 mg (has no administration in time range)  diphenhydrAMINE (BENADRYL) injection 25 mg (has no administration in time range)  sodium chloride 0.9 % bolus 1,000 mL (has no administration in time range)     Initial Impression / Assessment and Plan / ED Course  I have reviewed the triage vital signs and the nursing notes.  Pertinent labs & imaging results that were available during my care of the patient were reviewed by me and considered in my medical decision making (see chart for details).  Clinical Course as of Sep 27 2308  Mon Sep 27, 2017  1922 Patient states her headache is moderately improved after the Reglan and fluids.  Her CT shows no acute findings and interestingly no evidence of any sinus disease.  Patient states she is using over-the-counter nasal decongestants so I will prescribe her steroid nasal spray.  She is comfortable going home and has somebody for driving for.   [MB]    Clinical Course User Index [MB] Terrilee Files, MD     Final Clinical Impressions(s) / ED Diagnoses   Final diagnoses:  Frontal headache    ED Discharge Orders    None       Terrilee Files, MD 09/27/17 2310

## 2017-09-27 NOTE — Discharge Instructions (Addendum)
You were evaluated in the emergency department for a frontal headache that may be related to your sinuses.  You had a CAT scan that did not show any obvious findings.  We are prescribing a nasal spray that may help limit your nasal congestion.  Please follow-up with your doctor and return if any worsening symptoms.

## 2022-01-19 ENCOUNTER — Other Ambulatory Visit: Payer: Self-pay

## 2022-01-19 ENCOUNTER — Emergency Department (HOSPITAL_BASED_OUTPATIENT_CLINIC_OR_DEPARTMENT_OTHER)
Admission: EM | Admit: 2022-01-19 | Discharge: 2022-01-19 | Disposition: A | Discharge status: Home / Self Care | Payer: BC Managed Care – PPO | Attending: Emergency Medicine | Admitting: Emergency Medicine

## 2022-01-19 ENCOUNTER — Encounter (HOSPITAL_BASED_OUTPATIENT_CLINIC_OR_DEPARTMENT_OTHER): Payer: Self-pay | Admitting: Emergency Medicine

## 2022-01-19 DIAGNOSIS — R112 Nausea with vomiting, unspecified: Secondary | ICD-10-CM | POA: Insufficient documentation

## 2022-01-19 DIAGNOSIS — J020 Streptococcal pharyngitis: Secondary | ICD-10-CM | POA: Diagnosis not present

## 2022-01-19 LAB — CBC WITH DIFFERENTIAL/PLATELET
Abs Immature Granulocytes: 0.08 10*3/uL — ABNORMAL HIGH (ref 0.00–0.07)
Basophils Absolute: 0.1 10*3/uL (ref 0.0–0.1)
Basophils Relative: 0 %
Eosinophils Absolute: 0 10*3/uL (ref 0.0–0.5)
Eosinophils Relative: 0 %
HCT: 40.1 % (ref 36.0–46.0)
Hemoglobin: 13.6 g/dL (ref 12.0–15.0)
Immature Granulocytes: 0 %
Lymphocytes Relative: 3 %
Lymphs Abs: 0.6 10*3/uL — ABNORMAL LOW (ref 0.7–4.0)
MCH: 30.9 pg (ref 26.0–34.0)
MCHC: 33.9 g/dL (ref 30.0–36.0)
MCV: 91.1 fL (ref 80.0–100.0)
Monocytes Absolute: 0.9 10*3/uL (ref 0.1–1.0)
Monocytes Relative: 5 %
Neutro Abs: 17.6 10*3/uL — ABNORMAL HIGH (ref 1.7–7.7)
Neutrophils Relative %: 92 %
Platelets: 205 10*3/uL (ref 150–400)
RBC: 4.4 MIL/uL (ref 3.87–5.11)
RDW: 12.4 % (ref 11.5–15.5)
WBC: 19.2 10*3/uL — ABNORMAL HIGH (ref 4.0–10.5)
nRBC: 0 % (ref 0.0–0.2)

## 2022-01-19 LAB — BASIC METABOLIC PANEL
Anion gap: 10 (ref 5–15)
BUN: 17 mg/dL (ref 6–20)
CO2: 24 mmol/L (ref 22–32)
Calcium: 8.7 mg/dL — ABNORMAL LOW (ref 8.9–10.3)
Chloride: 102 mmol/L (ref 98–111)
Creatinine, Ser: 0.7 mg/dL (ref 0.44–1.00)
GFR, Estimated: >60 mL/min (ref >60)
Glucose, Bld: 117 mg/dL — ABNORMAL HIGH (ref 70–99)
Potassium: 3.6 mmol/L (ref 3.5–5.1)
Sodium: 136 mmol/L (ref 135–145)

## 2022-01-19 LAB — GROUP A STREP BY PCR: Group A Strep by PCR: DETECTED — AB

## 2022-01-19 MED ORDER — SODIUM CHLORIDE 0.9 % IV SOLN
500.0000 mg | Freq: Once | INTRAVENOUS | Status: AC
  Administered 2022-01-19: 500 mg via INTRAVENOUS
  Filled 2022-01-19: qty 5

## 2022-01-19 MED ORDER — SODIUM CHLORIDE 0.9 % IV BOLUS
1000.0000 mL | Freq: Once | INTRAVENOUS | Status: AC
  Administered 2022-01-19: 1000 mL via INTRAVENOUS

## 2022-01-19 MED ORDER — ONDANSETRON HCL 4 MG/2ML IJ SOLN
4.0000 mg | Freq: Once | INTRAMUSCULAR | Status: AC
  Administered 2022-01-19: 4 mg via INTRAVENOUS
  Filled 2022-01-19: qty 2

## 2022-01-19 MED ORDER — METOCLOPRAMIDE HCL 5 MG/ML IJ SOLN
10.0000 mg | Freq: Once | INTRAMUSCULAR | Status: AC | PRN
  Administered 2022-01-19: 10 mg via INTRAVENOUS
  Filled 2022-01-19: qty 2

## 2022-01-19 MED ORDER — AZITHROMYCIN 500 MG PO TABS: ORAL_TABLET | ORAL | 0 refills | Status: AC

## 2022-01-19 MED ORDER — ONDANSETRON 8 MG PO TBDP: 8.0000 mg | ORAL_TABLET | Freq: Three times a day (TID) | ORAL | 0 refills | Status: AC | PRN

## 2022-01-19 MED ORDER — PANTOPRAZOLE SODIUM 40 MG IV SOLR
40.0000 mg | Freq: Once | INTRAVENOUS | Status: AC
  Administered 2022-01-19: 40 mg via INTRAVENOUS
  Filled 2022-01-19: qty 10

## 2022-01-19 NOTE — ED Provider Notes (Signed)
[facility] Provider Note: Julie Dell, MD, FACEP  CSN: [id number] MRN: [id number] ARRIVAL: 01/19/22 at 0445 ROOM: [facility]   CHIEF COMPLAINT  Vomiting   HISTORY OF PRESENT ILLNESS  01/19/22 5:18 AM Julie Ingram is a 46 y.o. female with nausea and vomiting that began 2 days ago along with a sore throat.  The vomiting has now become retching.  She has had no diarrhea or abdominal pain with this.  She has had a fever as high as 103.  She states this is typical for when she gets strep throat.  She rates the pain in her throat as a 4 out of 10, worse with swallowing.   History reviewed. No pertinent past medical history.  Past Surgical History:  Procedure Laterality Date   CESAREAN SECTION     CHOLECYSTECTOMY     TUBAL LIGATION      History reviewed. No pertinent family history.  Social History   Tobacco Use   Smoking status: Never   Smokeless tobacco: Never  Substance Use Topics   Alcohol use: No   Drug use: No    Prior to Admission medications   Medication Sig Start Date End Date Taking? Authorizing Provider  azithromycin (ZITHROMAX) 500 MG tablet Take 1 tablet daily starting 01/20/2022. 01/19/22  Yes Jonathon Tan, MD  ondansetron (ZOFRAN-ODT) 8 MG disintegrating tablet Take 1 tablet (8 mg total) by mouth every 8 (eight) hours as needed for nausea or vomiting. 01/19/22  Yes Raphael Espe, MD  levonorgestrel-ethinyl estradiol (SEASONALE,INTROVALE,JOLESSA) 0.15-0.03 MG tablet Take 1 tablet by mouth daily.      [provider]    Allergies Aspirin, Codeine, and Penicillins   REVIEW OF SYSTEMS  Negative except as noted here or in the History of Present Illness.   PHYSICAL EXAMINATION  Initial Vital Signs Blood pressure 135/83, pulse (!) 106, temperature 98.5 F (36.9 C), temperature source Oral, resp. rate 20, height 5\' 3"  (1.6 m), weight 60.3 kg, SpO2 100 %.  Examination General: Well-developed, well-nourished female in no acute  distress but retching; appearance consistent with age of record HENT: normocephalic; atraumatic; tonsillar erythema and exudate; uvula midline Eyes: Normal appearance Neck: supple Heart: regular rate and rhythm Lungs: clear to auscultation bilaterally; tachycardia Abdomen: soft; nondistended; nontender; bowel sounds present Extremities: No deformity; full range of motion; pulses normal Neurologic: Awake, alert and oriented; motor function intact in all extremities and symmetric; no facial droop Skin: Warm and dry Psychiatric: Normal mood and affect   RESULTS  Summary of this visit's results, reviewed and interpreted by myself:   EKG Interpretation  Date/Time:    Ventricular Rate:    PR Interval:    QRS Duration:   QT Interval:    QTC Calculation:   R Axis:     Text Interpretation:         Laboratory Studies: Results for orders placed or performed during the hospital encounter of 01/19/22 (from the past 24 hour(s))  Group A Strep by PCR     Status: Abnormal   Collection Time: 01/19/22  5:12 AM   Specimen: Throat; Sterile Swab  Result Value Ref Range   Group A Strep by PCR DETECTED (A) NOT DETECTED  CBC with Differential     Status: Abnormal   Collection Time: 01/19/22  5:18 AM  Result Value Ref Range   WBC 19.2 (H) 4.0 - 10.5 K/uL   RBC 4.40 3.87 - 5.11 MIL/uL   Hemoglobin 13.6 12.0 - 15.0 g/dL   HCT 40.1  36.0 - 46.0 %   MCV 91.1 80.0 - 100.0 fL   MCH 30.9 26.0 - 34.0 pg   MCHC 33.9 30.0 - 36.0 g/dL   RDW 78.2 95.6 - 21.3 %   Platelets 205 150 - 400 K/uL   nRBC 0.0 0.0 - 0.2 %   Neutrophils Relative % 92 %   Neutro Abs 17.6 (H) 1.7 - 7.7 K/uL   Lymphocytes Relative 3 %   Lymphs Abs 0.6 (L) 0.7 - 4.0 K/uL   Monocytes Relative 5 %   Monocytes Absolute 0.9 0.1 - 1.0 K/uL   Eosinophils Relative 0 %   Eosinophils Absolute 0.0 0.0 - 0.5 K/uL   Basophils Relative 0 %   Basophils Absolute 0.1 0.0 - 0.1 K/uL   Immature Granulocytes 0 %   Abs Immature Granulocytes  0.08 (H) 0.00 - 0.07 K/uL  Basic metabolic panel     Status: Abnormal   Collection Time: 01/19/22  5:18 AM  Result Value Ref Range   Sodium 136 135 - 145 mmol/L   Potassium 3.6 3.5 - 5.1 mmol/L   Chloride 102 98 - 111 mmol/L   CO2 24 22 - 32 mmol/L   Glucose, Bld 117 (H) 70 - 99 mg/dL   BUN 17 6 - 20 mg/dL   Creatinine, Ser 0.86 0.44 - 1.00 mg/dL   Calcium 8.7 (L) 8.9 - 10.3 mg/dL   GFR, Estimated >57 >84 mL/min   Anion gap 10 5 - 15   Imaging Studies: No results found.  ED COURSE and MDM  Nursing notes, initial and subsequent vitals signs, including pulse oximetry, reviewed and interpreted by myself.  Vitals:   01/19/22 0504 01/19/22 0515  BP:  135/83  Pulse:  (!) 106  Resp:  20  Temp:  98.5 F (36.9 C)  TempSrc:  Oral  SpO2:  100%  Weight: 60.3 kg   Height: 5\' 3"  (1.6 m)    Medications  azithromycin (ZITHROMAX) 500 mg in sodium chloride 0.9 % 250 mL IVPB (500 mg Intravenous New Bag/Given 01/19/22 0607)  sodium chloride 0.9 % bolus 1,000 mL (1,000 mLs Intravenous New Bag/Given 01/19/22 0518)  ondansetron (ZOFRAN) injection 4 mg (4 mg Intravenous Given 01/19/22 0519)  metoCLOPramide (REGLAN) injection 10 mg (10 mg Intravenous Given 01/19/22 0625)  pantoprazole (PROTONIX) injection 40 mg (40 mg Intravenous Given 01/19/22 0521)   Patient positive for strep throat.  With she is allergic to penicillin and does not tolerate clindamycin.  We will treat her with Zithromax 500 mg daily for 5 days.  We will give the first dose in the ED IV.   6:05 AM Patient is nausea well controlled with IV Zofran.  IV fluid bolus infusing.    PROCEDURES  Procedures   ED DIAGNOSES     ICD-10-CM   1. Strep pharyngitis  J02.0     2. Nausea and vomiting in adult  R11.2          05-27-1989, MD 01/19/22 [phone]

## 2022-01-19 NOTE — ED Triage Notes (Signed)
Fever and emesis  starting Saturday dry heaves today with dizziness. Also c/o sore throat says last time she had this it was strep throat.
